# Patient Record
Sex: Male | Born: 1995 | Race: White | Hispanic: No
Health system: Western US, Academic
[De-identification: ages and names within clinical notes are randomized; demographics above are authoritative.]

## PROBLEM LIST (undated history)

## (undated) DIAGNOSIS — Z789 Other specified health status: Secondary | ICD-10-CM

## (undated) HISTORY — PX: HERNIA REPAIR: SHX5222

## (undated) HISTORY — DX: Other specified health status: Z78.9

---
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
## 2016-09-25 DIAGNOSIS — Z789 Other specified health status: Secondary | ICD-10-CM

## 2016-09-25 HISTORY — DX: Other specified health status: Z78.9

## 2016-12-19 ENCOUNTER — Other Ambulatory Visit: Payer: Self-pay | Admitting: Family Medicine

## 2016-12-19 DIAGNOSIS — K4090 Unilateral inguinal hernia, without obstruction or gangrene, not specified as recurrent: Secondary | ICD-10-CM

## 2016-12-21 ENCOUNTER — Ambulatory Visit
Admission: RE | Admit: 2016-12-21 | Discharge: 2016-12-21 | Disposition: A | Discharge status: Home / Self Care | Payer: BLUE CROSS/BLUE SHIELD | Source: Ambulatory Visit | Attending: Family Medicine | Admitting: Family Medicine

## 2016-12-21 DIAGNOSIS — K4090 Unilateral inguinal hernia, without obstruction or gangrene, not specified as recurrent: Secondary | ICD-10-CM

## 2016-12-21 DIAGNOSIS — N5089 Other specified disorders of the male genital organs: Secondary | ICD-10-CM | POA: Diagnosis present

## 2016-12-21 DIAGNOSIS — N432 Other hydrocele: Secondary | ICD-10-CM | POA: Insufficient documentation

## 2016-12-27 ENCOUNTER — Encounter: Payer: Self-pay | Admitting: *Deleted

## 2017-01-02 ENCOUNTER — Ambulatory Visit (INDEPENDENT_AMBULATORY_CARE_PROVIDER_SITE_OTHER): Payer: BLUE CROSS/BLUE SHIELD | Admitting: General Surgery

## 2017-01-02 ENCOUNTER — Encounter: Payer: Self-pay | Admitting: General Surgery

## 2017-01-02 VITALS — BP 122/74 | HR 56 | Resp 12 | Ht 70.0 in | Wt 182.0 lb

## 2017-01-02 DIAGNOSIS — K4090 Unilateral inguinal hernia, without obstruction or gangrene, not specified as recurrent: Secondary | ICD-10-CM

## 2017-01-02 NOTE — Progress Notes (Signed)
Patient ID: Tom Wallace, male   DOB: 1995/10/17, 21 y.o.   MRN: [id number]  Chief Complaint  Patient presents with  . Hernia    HPI Tom Wallace is a 21 y.o. male.  Patient here today for an evaluation of a hernia.  He states he has been lifting weights recently and he noticed a knot in the right groin about 2-3 weeks ago.  It does not seem to be causing any abdominal pain.  No nausea, vomiting, constipation or diarrhea noted. Ultrasound was done 12-21-16. Patient is concerned about leaving for summer camp at the end of May without this being resolved.   HPI  Past Medical History:  Diagnosis Date  . Patient denies medical problems 2018    Past Surgical History:  Procedure Laterality Date  . TONSILLECTOMY AND ADENOIDECTOMY  2010    History reviewed. No pertinent family history.  Social History Social History  Substance Use Topics  . Smoking status: Never Smoker  . Smokeless tobacco: Never Used  . Alcohol use Yes     Comment: occasionally    No Known Allergies  No current outpatient prescriptions on file.   No current facility-administered medications for this visit.     Review of Systems Review of Systems  Constitutional: Negative.   Respiratory: Negative.   Cardiovascular: Negative.     Blood pressure 122/74, pulse (!) 56, resp. rate 12, height  (1.778 m), weight 182 lb (82.6 kg).  Physical Exam Physical Exam  Constitutional: He is oriented to person, place, and time. He appears well-developed and well-nourished.  HENT:  Mouth/Throat: Oropharynx is clear and moist.  Eyes: Conjunctivae are normal. No scleral icterus.  Neck: Neck supple.  Cardiovascular: Normal rate, regular rhythm and normal heart sounds.   Pulmonary/Chest: Effort normal and breath sounds normal.  Abdominal: Soft. Normal appearance and bowel sounds are normal. There is no tenderness. No hernia.  Lymphadenopathy:    He has no cervical adenopathy.  Neurological: He is alert and  oriented to person, place, and time.  Skin: Skin is warm and dry.  Psychiatric: His behavior is normal.   No hernia palpated on right or left inguinal region.   Data Reviewed  [place] - Rt lower extrem from 12/21/16 showed possible hernia containing fat but no bowel. Hernia was noted as palpable at that time.    Assessment    No apparent hernia noted. It's possible that there is a small right inguinal hernia as per [place], but it was reduced at the time of exam and therefore was not palpable. It may become more pronounced over time, but intervention is not currently indicated. Ultrasound is not ideal imaging for hernias, CT scan may be more helpful.     Plan    Recommend CT scan for further evaluation, he will discuss with his parents and let us know what he decides.  The patient is aware to call back for any pain, questions or new concerns.    HPI, Physical Exam, Assessment and Plan have been scribed under the direction and in the presence of Kathreen Cosier, MD  Dorathy Daft, RN   I have completed the exam and reviewed the above documentation for accuracy and completeness.  I agree with the above.  Museum/gallery conservator has been used and any errors in dictation or transcription are unintentional.  Gizel Riedlinger G. Evette Cristal, M.D., F.A.C.S.  Gerlene Burdock G 01/08/2017, 10:17 AM

## 2017-01-02 NOTE — Patient Instructions (Addendum)
The patient is aware to call back for any questions or concerns.  Inguinal Hernia, Adult An inguinal hernia is when fat or the intestines push through the area where the leg meets the lower belly (groin) and make a rounded lump (bulge). This condition happens over time. There are three types of inguinal hernias. These types include:  Hernias that can be pushed back into the belly (are reducible).  Hernias that cannot be pushed back into the belly (are incarcerated).  Hernias that cannot be pushed back into the belly and lose their blood supply (get strangulated). This type needs emergency surgery. Follow these instructions at home: Lifestyle   Drink enough fluid to keep your urine (pee) clear or pale yellow.  Eat plenty of fruits, vegetables, and whole grains. These have a lot of fiber. Talk with your doctor if you have questions.  Avoid lifting heavy objects.  Avoid standing for long periods of time.  Do not use tobacco products. These include cigarettes, chewing tobacco, or e-cigarettes. If you need help quitting, ask your doctor.  Try to stay at a healthy weight. General instructions   Do not try to force the hernia back in.  Watch your hernia for any changes in color or size. Let your doctor know if there are any changes.  Take over-the-counter and prescription medicines only as told by your doctor.  Keep all follow-up visits as told by your doctor. This is important. Contact a doctor if:  You have a fever.  You have new symptoms.  Your symptoms get worse. Get help right away if:  The area where the legs meets the lower belly has:  Pain that gets worse suddenly.  A bulge that gets bigger suddenly and does not go down.  A bulge that turns red or purple.  A bulge that is painful to the touch.  You are a man and your scrotum:  Suddenly feels painful.  Suddenly changes in size.  You feel sick to your stomach (nauseous) and this feeling does not go  away.  You throw up (vomit) and this keeps happening.  You feel your heart beating a lot more quickly than normal.  You cannot poop (have a bowel movement) or pass gas. This information is not intended to replace advice given to you by your health care provider. Make sure you discuss any questions you have with your health care provider. Document Released: 10/12/2006 Document Revised: 02/17/2016 Document Reviewed: 07/22/2014 Elsevier Interactive Patient Education  2017 Elsevier Inc.  

## 2017-01-30 ENCOUNTER — Encounter: Payer: Self-pay | Admitting: General Surgery

## 2017-01-30 ENCOUNTER — Ambulatory Visit (INDEPENDENT_AMBULATORY_CARE_PROVIDER_SITE_OTHER): Payer: BLUE CROSS/BLUE SHIELD | Admitting: General Surgery

## 2017-01-30 VITALS — BP 130/80 | HR 84 | Resp 12 | Ht 70.0 in | Wt 182.0 lb

## 2017-01-30 DIAGNOSIS — R1031 Right lower quadrant pain: Secondary | ICD-10-CM | POA: Diagnosis not present

## 2017-01-30 NOTE — Progress Notes (Signed)
Patient ID: Tom Wallace, male   DOB: 28-Jan-1996, 21 y.o.   MRN: [id number]  Chief Complaint  Patient presents with  . Other    HPI Tom Wallace is a 21 y.o. male here today for his re check right inguinal hernia  . Patient states when he runs the area is painful and swollen. He did not get a CT yet. Marland KitchenHPI  Past Medical History:  Diagnosis Date  . Patient denies medical problems 2018    Past Surgical History:  Procedure Laterality Date  . TONSILLECTOMY AND ADENOIDECTOMY  2010    No family history on file.  Social History Social History  Substance Use Topics  . Smoking status: Never Smoker  . Smokeless tobacco: Never Used  . Alcohol use Yes     Comment: occasionally    No Known Allergies  No current outpatient prescriptions on file.   No current facility-administered medications for this visit.     Review of Systems Review of Systems  Constitutional: Negative.   Respiratory: Negative.   Cardiovascular: Negative.     Blood pressure 130/80, pulse 84, resp. rate 12, height _0  (1.778 m), weight 182 lb (82.6 kg).  Physical Exam Physical Exam  Constitutional: He is oriented to person, place, and time. He appears well-developed and well-nourished.  Abdominal: Soft. Normal appearance. There is no tenderness.  Neurological: He is alert and oriented to person, place, and time.  Skin: Skin is warm and dry.    Data Reviewed Prior notes reviewed   Assessment    Right inguinal pain. No apparent hernia noted on exam. It's possible that there is a small right inguinal hernia, but would need a CT scan to confirm before considering surgical repair. Informed patient of this, and he was agreeable.     Plan    Patient to have a ct scan done . Follow up TBD.     HPI, Physical Exam, Assessment and Plan have been scribed under the direction and in the presence of Mckinley Jewel, MD  Gaspar Cola, CMA  The patient is scheduled for a CT abdomen pelvis with oral  contrast only at [facility] on 01/31/17 at 3:00 pm. He will arrive by 2:45 pm and have nothing to eat or drink for 4 hours prior. He will pick up a prep kit today. The patient is aware of date, time, and instructions.  Documented by Lesly Rubenstein LPN  I have completed the exam and reviewed the above documentation for accuracy and completeness.  I agree with the above.  Haematologist has been used and any errors in dictation or transcription are unintentional.  Sharnika Binney G. Jamal Collin, M.D., F.A.C.S.  Junie Panning G 01/30/2017, 12:59 PM

## 2017-01-30 NOTE — Patient Instructions (Addendum)
Patient to have a ct scan done .   The patient is scheduled for a CT abdomen pelvis with oral contrast only at [facility] on 01/31/17 at 3:00 pm. He will arrive by 2:45 pm and have nothing to eat or drink for 4 hours prior. He will pick up a prep kit today. The patient is aware of date, time, and instructions.

## 2017-01-31 ENCOUNTER — Ambulatory Visit
Admission: RE | Admit: 2017-01-31 | Discharge: 2017-01-31 | Disposition: A | Discharge status: Home / Self Care | Payer: BLUE CROSS/BLUE SHIELD | Source: Ambulatory Visit | Attending: General Surgery | Admitting: General Surgery

## 2017-01-31 DIAGNOSIS — R935 Abnormal findings on diagnostic imaging of other abdominal regions, including retroperitoneum: Secondary | ICD-10-CM | POA: Diagnosis not present

## 2017-01-31 DIAGNOSIS — R1031 Right lower quadrant pain: Secondary | ICD-10-CM | POA: Diagnosis present

## 2017-01-31 NOTE — Progress Notes (Signed)
Appointment scheduled for 02-01-17 at 10:30 am. Patient aware.

## 2017-02-01 ENCOUNTER — Ambulatory Visit (INDEPENDENT_AMBULATORY_CARE_PROVIDER_SITE_OTHER): Payer: BLUE CROSS/BLUE SHIELD | Admitting: General Surgery

## 2017-02-01 ENCOUNTER — Encounter: Payer: Self-pay | Admitting: General Surgery

## 2017-02-01 VITALS — BP 124/68 | HR 54 | Resp 12 | Ht 70.0 in | Wt 182.0 lb

## 2017-02-01 DIAGNOSIS — R1031 Right lower quadrant pain: Secondary | ICD-10-CM

## 2017-02-01 NOTE — Progress Notes (Signed)
Patient ID: Tom Wallace, male   DOB: 09/27/1995, 20 y.o.   MRN: [id number]  Chief Complaint  Patient presents with  . Other    HPI Tom Wallace is a 20 y.o. male here today to discuss ct scan results .  HPI  Past Medical History:  Diagnosis Date  . Patient denies medical problems 2018    Past Surgical History:  Procedure Laterality Date  . TONSILLECTOMY AND ADENOIDECTOMY  2010    No family history on file.  Social History Social History  Substance Use Topics  . Smoking status: Never Smoker  . Smokeless tobacco: Never Used  . Alcohol use Yes     Comment: occasionally    No Known Allergies  No current outpatient prescriptions on file.   No current facility-administered medications for this visit.     Review of Systems Review of Systems  Constitutional: Negative.   Respiratory: Negative.   Cardiovascular: Negative.     Blood pressure 124/68, pulse (!) 54, resp. rate 12, height 5' 10" (1.778 m), weight 182 lb (82.6 kg).  Physical Exam Physical Exam  Constitutional: He is oriented to person, place, and time. He appears well-developed and well-nourished.  HENT:  Mouth/Throat: No oropharyngeal exudate.  Eyes: Conjunctivae are normal. No scleral icterus.  Neck: Neck supple.  Cardiovascular: Normal rate, regular rhythm and normal heart sounds.   Pulmonary/Chest: Effort normal and breath sounds normal.  Abdominal: Soft. Bowel sounds are normal.  Lymphadenopathy:    He has no cervical adenopathy.  Neurological: He is alert and oriented to person, place, and time.  Skin: Skin is warm and dry.  Psychiatric: His behavior is normal.    Data Reviewed CT reviewed  IMPRESSION: Normal-appearing appendix.  Minimal fat within the inguinal canals bilaterally right slightly greater than left.  In the right hemipelvis anteriorly near the origin of the inguinal canal a small amount of induration in the mesenteric fat is noted. A few small lymph nodes are  noted in this may represent some very early mesenteric adenitis. This does not appear to be related to the adjacent bowel or any of the other adjacent structures.  Assessment    Ongoing right groin pain. Ct findings of inflamed mesentery/omentum near right groin- uncertain nature.  Pt advised fully on these findings. Diagnostic laparoscopy is reasonable given his ongoing pain in this region.  He is agreeable to this plan. Plan    Patient to be schedule for diagnostic laparoscopy.       Felita Bump G 02/01/2017, 10:47 AM   

## 2017-02-01 NOTE — Addendum Note (Signed)
Addended by: Kieth BrightlySANKAR, SEEPLAPUTHUR G on: 02/01/2017 02:45 PM   Modules accepted: Orders, SmartSet

## 2017-02-01 NOTE — Patient Instructions (Signed)
Patient to be scheduled for diagnostic laparoscopy.

## 2017-02-06 ENCOUNTER — Encounter
Admission: RE | Admit: 2017-02-06 | Discharge: 2017-02-06 | Disposition: A | Discharge status: Home / Self Care | Payer: BLUE CROSS/BLUE SHIELD | Source: Ambulatory Visit | Attending: General Surgery | Admitting: General Surgery

## 2017-02-06 NOTE — Patient Instructions (Signed)
  Your procedure is scheduled on: 02-12-17 Report to Same Day Surgery 2nd floor medical mall [facility](Medical Mall Entrance-take elevator on left to 2nd floor.  Check in with surgery information desk.) To find out your arrival time please call [phone] between 1PM - 3PM on 02-11-17  Remember: Instructions that are not followed completely may result in serious medical risk, up to and including death, or upon the discretion of your surgeon and anesthesiologist your surgery may need to be rescheduled.    _x___ 1. Do not eat food or drink liquids after midnight. No gum chewing or hard candies.     __x__ 2. No Alcohol for 24 hours before or after surgery.   __x__3. No Smoking for 24 prior to surgery.   ____  4. Bring all medications with you on the day of surgery if instructed.    __x__ 5. Notify your doctor if there is any change in your medical condition     (cold, fever, infections).     Do not wear jewelry, make-up, hairpins, clips or nail polish.  Do not wear lotions, powders, or perfumes. You may wear deodorant.  Do not shave 48 hours prior to surgery. Men may shave face and neck.  Do not bring valuables to the hospital.    [facility] any belongings or valuables.               Contacts, dentures or bridgework may not be worn into surgery.  Leave your suitcase in the car. After surgery it may be brought to your room.  For patients admitted to the hospital, discharge time is determined by your treatment team.   Patients discharged the day of surgery will not be allowed to drive home.  You will need someone to drive you home and stay with you the night of your procedure.    Please read over the following fact sheets that you were given:     ____ Take anti-hypertensive (unless it includes a diuretic), cardiac, seizure, asthma,     anti-reflux and psychiatric medicines. These include:  1. NONE  2.  3.  4.  5.  6.  ____Fleets enema or Magnesium Citrate as  directed.   ____ Use CHG Soap or sage wipes as directed on instruction sheet   ____ Use inhalers on the day of surgery and bring to hospital day of surgery  ____ Stop Metformin and Janumet 2 days prior to surgery.    ____ Take 1/2 of usual insulin dose the night before surgery and none on the morning surgery.   ____ Follow recommendations from Cardiologist, Pulmonologist or PCP regarding stopping Aspirin, Coumadin, Pllavix ,Eliquis, Effient, or Pradaxa, and Pletal.  X____Stop Anti-inflammatories such as ADVIL, Aleve, Ibuprofen, Motrin, Naproxen, Naprosyn, Goodies powders or aspirin products NOW-OK to take Tylenol    ____ Stop supplements until after surgery.     ____ Bring C-Pap to the hospital.

## 2017-02-11 MED ORDER — CEFAZOLIN SODIUM-DEXTROSE 2-4 GM/100ML-% IV SOLN
2.0000 g | INTRAVENOUS | Status: AC
  Administered 2017-02-12: 2 g via INTRAVENOUS

## 2017-02-12 ENCOUNTER — Ambulatory Visit: Payer: BLUE CROSS/BLUE SHIELD | Admitting: Anesthesiology

## 2017-02-12 ENCOUNTER — Encounter
Admission: RE | Disposition: A | Discharge status: Home / Self Care | Payer: Self-pay | Source: Ambulatory Visit | Attending: General Surgery

## 2017-02-12 ENCOUNTER — Encounter: Payer: Self-pay | Admitting: *Deleted

## 2017-02-12 ENCOUNTER — Ambulatory Visit
Admission: RE | Admit: 2017-02-12 | Discharge: 2017-02-12 | Disposition: A | Discharge status: Home / Self Care | Payer: BLUE CROSS/BLUE SHIELD | Source: Ambulatory Visit | Attending: General Surgery | Admitting: General Surgery

## 2017-02-12 DIAGNOSIS — R1031 Right lower quadrant pain: Secondary | ICD-10-CM

## 2017-02-12 DIAGNOSIS — K4090 Unilateral inguinal hernia, without obstruction or gangrene, not specified as recurrent: Secondary | ICD-10-CM | POA: Insufficient documentation

## 2017-02-12 HISTORY — PX: LAPAROSCOPY: SHX197

## 2017-02-12 HISTORY — PX: INGUINAL HERNIA REPAIR: SHX194

## 2017-02-12 SURGERY — LAPAROSCOPY, DIAGNOSTIC
Anesthesia: General | Laterality: Right | Wound class: Clean

## 2017-02-12 MED ORDER — SUGAMMADEX SODIUM 200 MG/2ML IV SOLN
INTRAVENOUS | Status: AC
  Filled 2017-02-12: qty 2

## 2017-02-12 MED ORDER — PROPOFOL 10 MG/ML IV BOLUS
INTRAVENOUS | Status: DC | PRN
  Administered 2017-02-12: 150 mg via INTRAVENOUS
  Administered 2017-02-12: 50 mg via INTRAVENOUS

## 2017-02-12 MED ORDER — ACETAMINOPHEN 10 MG/ML IV SOLN
INTRAVENOUS | Status: DC | PRN
  Administered 2017-02-12: 1000 mg via INTRAVENOUS

## 2017-02-12 MED ORDER — MIDAZOLAM HCL 2 MG/2ML IJ SOLN
INTRAMUSCULAR | Status: DC | PRN
  Administered 2017-02-12 (×2): 1 mg via INTRAVENOUS

## 2017-02-12 MED ORDER — OXYCODONE-ACETAMINOPHEN 5-325 MG PO TABS
1.0000 | ORAL_TABLET | ORAL | 0 refills | Status: DC | PRN
Start: 2017-02-12 — End: 2017-02-20

## 2017-02-12 MED ORDER — MIDAZOLAM HCL 2 MG/2ML IJ SOLN
INTRAMUSCULAR | Status: AC
  Filled 2017-02-12: qty 2

## 2017-02-12 MED ORDER — CEFAZOLIN SODIUM-DEXTROSE 2-4 GM/100ML-% IV SOLN
INTRAVENOUS | Status: AC
  Filled 2017-02-12: qty 100

## 2017-02-12 MED ORDER — FENTANYL CITRATE (PF) 100 MCG/2ML IJ SOLN
INTRAMUSCULAR | Status: AC
  Administered 2017-02-12: 25 ug via INTRAVENOUS
  Filled 2017-02-12: qty 2

## 2017-02-12 MED ORDER — ROCURONIUM BROMIDE 100 MG/10ML IV SOLN
INTRAVENOUS | Status: DC | PRN
  Administered 2017-02-12 (×2): 20 mg via INTRAVENOUS
  Administered 2017-02-12: 30 mg via INTRAVENOUS

## 2017-02-12 MED ORDER — OXYCODONE-ACETAMINOPHEN 5-325 MG PO TABS
1.0000 | ORAL_TABLET | Freq: Once | ORAL | Status: AC
  Administered 2017-02-12: 1 via ORAL

## 2017-02-12 MED ORDER — FENTANYL CITRATE (PF) 100 MCG/2ML IJ SOLN
INTRAMUSCULAR | Status: AC
  Filled 2017-02-12: qty 2

## 2017-02-12 MED ORDER — FAMOTIDINE 20 MG PO TABS
ORAL_TABLET | ORAL | Status: AC
  Filled 2017-02-12: qty 1

## 2017-02-12 MED ORDER — ACETAMINOPHEN 10 MG/ML IV SOLN
INTRAVENOUS | Status: AC
  Filled 2017-02-12: qty 100

## 2017-02-12 MED ORDER — LACTATED RINGERS IV SOLN
INTRAVENOUS | Status: DC
  Administered 2017-02-12 (×2): via INTRAVENOUS

## 2017-02-12 MED ORDER — PROPOFOL 10 MG/ML IV BOLUS
INTRAVENOUS | Status: AC
  Filled 2017-02-12: qty 20

## 2017-02-12 MED ORDER — FENTANYL CITRATE (PF) 100 MCG/2ML IJ SOLN
INTRAMUSCULAR | Status: DC | PRN
  Administered 2017-02-12 (×3): 50 ug via INTRAVENOUS

## 2017-02-12 MED ORDER — EPHEDRINE SULFATE 50 MG/ML IJ SOLN
INTRAMUSCULAR | Status: DC | PRN
  Administered 2017-02-12: 10 mg via INTRAVENOUS

## 2017-02-12 MED ORDER — CHLORHEXIDINE GLUCONATE CLOTH 2 % EX PADS: 6.0000 | MEDICATED_PAD | Freq: Once | CUTANEOUS | Status: DC

## 2017-02-12 MED ORDER — ROCURONIUM BROMIDE 50 MG/5ML IV SOLN
INTRAVENOUS | Status: AC
  Filled 2017-02-12: qty 1

## 2017-02-12 MED ORDER — LIDOCAINE HCL (CARDIAC) 20 MG/ML IV SOLN
INTRAVENOUS | Status: DC | PRN
  Administered 2017-02-12: 60 mg via INTRAVENOUS

## 2017-02-12 MED ORDER — OXYCODONE-ACETAMINOPHEN 5-325 MG PO TABS
ORAL_TABLET | ORAL | Status: AC
  Filled 2017-02-12: qty 1

## 2017-02-12 MED ORDER — FENTANYL CITRATE (PF) 100 MCG/2ML IJ SOLN
25.0000 ug | INTRAMUSCULAR | Status: DC | PRN
  Administered 2017-02-12 (×3): 25 ug via INTRAVENOUS

## 2017-02-12 MED ORDER — ROCURONIUM BROMIDE 50 MG/5ML IV SOLN
INTRAVENOUS | Status: AC
Start: 2017-02-12 — End: 2017-02-12
  Filled 2017-02-12: qty 1

## 2017-02-12 MED ORDER — EPHEDRINE SULFATE 50 MG/ML IJ SOLN
INTRAMUSCULAR | Status: AC
  Filled 2017-02-12: qty 1

## 2017-02-12 MED ORDER — LIDOCAINE HCL (PF) 2 % IJ SOLN
INTRAMUSCULAR | Status: AC
  Filled 2017-02-12: qty 2

## 2017-02-12 MED ORDER — ONDANSETRON HCL 4 MG/2ML IJ SOLN
INTRAMUSCULAR | Status: AC
  Filled 2017-02-12: qty 2

## 2017-02-12 MED ORDER — SUGAMMADEX SODIUM 500 MG/5ML IV SOLN
INTRAVENOUS | Status: DC | PRN
  Administered 2017-02-12: 164.2 mg via INTRAVENOUS

## 2017-02-12 MED ORDER — FAMOTIDINE 20 MG PO TABS
20.0000 mg | ORAL_TABLET | Freq: Once | ORAL | Status: AC
  Administered 2017-02-12: 20 mg via ORAL

## 2017-02-12 MED ORDER — ONDANSETRON HCL 4 MG/2ML IJ SOLN: 4.0000 mg | Freq: Once | INTRAMUSCULAR | Status: DC | PRN

## 2017-02-12 MED ORDER — GLYCOPYRROLATE 0.2 MG/ML IJ SOLN: INTRAMUSCULAR | Status: DC | PRN

## 2017-02-12 SURGICAL SUPPLY — 49 items
BLADE CLIPPER SURG (BLADE) ×4 IMPLANT
BLADE SURG 10 STRL SS SAFETY (BLADE) IMPLANT
BLADE SURG 11 STRL SS SAFETY (MISCELLANEOUS) ×4 IMPLANT
BLADE SURG 15 STRL SS SAFETY (BLADE) IMPLANT
CANISTER SUCT 1200ML W/VALVE (MISCELLANEOUS) ×4 IMPLANT
CANNULA DILATOR 10 W/SLV (CANNULA) ×3 IMPLANT
CANNULA DILATOR 10MM W/SLV (CANNULA) ×1
CATH TRAY 16F METER LATEX (MISCELLANEOUS) ×4 IMPLANT
CHLORAPREP W/TINT 26ML (MISCELLANEOUS) ×4 IMPLANT
CLEANER CAUTERY TIP 5X5 PAD (MISCELLANEOUS) IMPLANT
DEFOGGER SCOPE WARMER CLEARIFY (MISCELLANEOUS) ×4 IMPLANT
DERMABOND ADVANCED (GAUZE/BANDAGES/DRESSINGS) ×2
DERMABOND ADVANCED .7 DNX12 (GAUZE/BANDAGES/DRESSINGS) ×2 IMPLANT
DEVICE SECURE STRAP 25 ABSORB (INSTRUMENTS) ×4 IMPLANT
DRAPE INCISE IOBAN 66X45 STRL (DRAPES) ×4 IMPLANT
ELECT REM PT RETURN 9FT ADLT (ELECTROSURGICAL) ×4
ELECTRODE REM PT RTRN 9FT ADLT (ELECTROSURGICAL) ×2 IMPLANT
GLOVE BIO SURGEON STRL SZ7 (GLOVE) ×16 IMPLANT
GOWN STRL REUS W/ TWL LRG LVL3 (GOWN DISPOSABLE) ×4 IMPLANT
GOWN STRL REUS W/TWL LRG LVL3 (GOWN DISPOSABLE) ×4
GRASPER SUT TROCAR 14GX15 (MISCELLANEOUS) ×4 IMPLANT
HANDLE YANKAUER SUCT BULB TIP (MISCELLANEOUS) IMPLANT
HOLDER FOLEY CATH W/STRAP (MISCELLANEOUS) IMPLANT
IRRIGATION STRYKERFLOW (MISCELLANEOUS) ×2 IMPLANT
IRRIGATOR STRYKERFLOW (MISCELLANEOUS) ×4
IV LACTATED RINGERS 1000ML (IV SOLUTION) ×4 IMPLANT
KIT RM TURNOVER STRD PROC AR (KITS) ×4 IMPLANT
LABEL OR SOLS (LABEL) ×4 IMPLANT
MESH 3DMAX 3X5 RT MED (Mesh General) ×4 IMPLANT
NDL INSUFF ACCESS 14 VERSASTEP (NEEDLE) ×4 IMPLANT
NS IRRIG 500ML POUR BTL (IV SOLUTION) ×4 IMPLANT
PACK LAP CHOLECYSTECTOMY (MISCELLANEOUS) ×4 IMPLANT
PAD CLEANER CAUTERY TIP 5X5 (MISCELLANEOUS)
PENCIL ELECTRO HAND CTR (MISCELLANEOUS) IMPLANT
POUCH ENDO CATCH 10MM SPEC (MISCELLANEOUS) IMPLANT
SCISSORS METZENBAUM CVD 33 (INSTRUMENTS) ×4 IMPLANT
SHEARS HARMONIC ACE PLUS 36CM (ENDOMECHANICALS) IMPLANT
SLEEVE ENDOPATH XCEL 5M (ENDOMECHANICALS) IMPLANT
SPONGE LAP 18X18 5 PK (GAUZE/BANDAGES/DRESSINGS) IMPLANT
STAPLER SKIN PROX 35W (STAPLE) IMPLANT
SUT SILK 3-0 (SUTURE)
SUT SILK 3-0 SH-1 18XCR BRD (SUTURE)
SUT VIC AB 0 SH 27 (SUTURE) ×4 IMPLANT
SUT VIC AB 4-0 FS2 27 (SUTURE) ×4 IMPLANT
SUTURE SILK 3-0 SH-1 18XCR BRD (SUTURE) IMPLANT
SYR BULB IRRIG 60ML STRL (SYRINGE) IMPLANT
TROCAR XCEL NON-BLD 11X100MML (ENDOMECHANICALS) ×4 IMPLANT
TROCAR XCEL NON-BLD 5MMX100MML (ENDOMECHANICALS) ×4 IMPLANT
TUBING INSUFFLATOR HI FLOW (MISCELLANEOUS) ×4 IMPLANT

## 2017-02-12 NOTE — Anesthesia Procedure Notes (Signed)
Procedure Name: Intubation Date/Time: 02/12/2017 7:25 AM Performed by: Allean Found Pre-anesthesia Checklist: Emergency Drugs available, Suction available, Patient identified, Patient being monitored and Timeout performed Patient Re-evaluated:Patient Re-evaluated prior to inductionOxygen Delivery Method: Circle system utilized Preoxygenation: Pre-oxygenation with 100% oxygen Intubation Type: IV induction Ventilation: Mask ventilation without difficulty Laryngoscope Size: Mac and 3 Grade View: Grade I Tube type: Oral Tube size: 7.5 mm Number of attempts: 1 Airway Equipment and Method: Stylet Placement Confirmation: ETT inserted through vocal cords under direct vision,  positive ETCO2 and breath sounds checked- equal and bilateral Secured at: 23 cm Tube secured with: Tape Dental Injury: Teeth and Oropharynx as per pre-operative assessment

## 2017-02-12 NOTE — Anesthesia Preprocedure Evaluation (Signed)
Anesthesia Evaluation  Patient identified by MRN, date of birth, ID band Patient awake    Reviewed: Allergy & Precautions, NPO status , Patient's Chart, lab work & pertinent test results, reviewed documented beta blocker date and time   Airway Mallampati: II  TM Distance: >3 FB     Dental  (+) Chipped   Pulmonary           Cardiovascular      Neuro/Psych    GI/Hepatic   Endo/Other    Renal/GU      Musculoskeletal   Abdominal   Peds  Hematology   Anesthesia Other Findings   Reproductive/Obstetrics                             Anesthesia Physical Anesthesia Plan  ASA: II  Anesthesia Plan: General   Post-op Pain Management:    Induction: Intravenous  Airway Management Planned: Oral ETT  Additional Equipment:   Intra-op Plan:   Post-operative Plan:   Informed Consent: I have reviewed the patients History and Physical, chart, labs and discussed the procedure including the risks, benefits and alternatives for the proposed anesthesia with the patient or authorized representative who has indicated his/her understanding and acceptance.     Plan Discussed with: CRNA  Anesthesia Plan Comments:         Anesthesia Quick Evaluation  

## 2017-02-12 NOTE — Interval H&P Note (Signed)
History and Physical Interval Note:  02/12/2017 7:08 AM  Harrold DonathNathan Goldring  has presented today for surgery, with the diagnosis of RIGHT GROIN PAIN  The various methods of treatment have been discussed with the patient and family. After consideration of risks, benefits and other options for treatment, the patient has consented to  Procedure(s): LAPAROSCOPY DIAGNOSTIC (N/A) as a surgical intervention .  The patient's history has been reviewed, patient examined, no change in status, stable for surgery.  I have reviewed the patient's chart and labs.  Questions were answered to the patient's satisfaction.     Arasely Akkerman G

## 2017-02-12 NOTE — H&P (View-Only) (Signed)
Patient ID: Tom Wallace, male   DOB: 1996/01/29, 21 y.o.   MRN: [id number]  Chief Complaint  Patient presents with  . Other    HPI Tom Wallace is a 21 y.o. male here today to discuss ct scan results .  HPI  Past Medical History:  Diagnosis Date  . Patient denies medical problems 2018    Past Surgical History:  Procedure Laterality Date  . TONSILLECTOMY AND ADENOIDECTOMY  2010    No family history on file.  Social History Social History  Substance Use Topics  . Smoking status: Never Smoker  . Smokeless tobacco: Never Used  . Alcohol use Yes     Comment: occasionally    No Known Allergies  No current outpatient prescriptions on file.   No current facility-administered medications for this visit.     Review of Systems Review of Systems  Constitutional: Negative.   Respiratory: Negative.   Cardiovascular: Negative.     Blood pressure 124/68, pulse (!) 54, resp. rate 12, height 5\' 10"  (1.778 m), weight 182 lb (82.6 kg).  Physical Exam Physical Exam  Constitutional: He is oriented to person, place, and time. He appears well-developed and well-nourished.  HENT:  Mouth/Throat: No oropharyngeal exudate.  Eyes: Conjunctivae are normal. No scleral icterus.  Neck: Neck supple.  Cardiovascular: Normal rate, regular rhythm and normal heart sounds.   Pulmonary/Chest: Effort normal and breath sounds normal.  Abdominal: Soft. Bowel sounds are normal.  Lymphadenopathy:    He has no cervical adenopathy.  Neurological: He is alert and oriented to person, place, and time.  Skin: Skin is warm and dry.  Psychiatric: His behavior is normal.    Data Reviewed CT reviewed  IMPRESSION: Normal-appearing appendix.  Minimal fat within the inguinal canals bilaterally right slightly greater than left.  In the right hemipelvis anteriorly near the origin of the inguinal canal a small amount of induration in the mesenteric fat is noted. A few small lymph nodes are  noted in this may represent some very early mesenteric adenitis. This does not appear to be related to the adjacent bowel or any of the other adjacent structures.  Assessment    Ongoing right groin pain. Ct findings of inflamed mesentery/omentum near right groin- uncertain nature.  Pt advised fully on these findings. Diagnostic laparoscopy is reasonable given his ongoing pain in this region.  He is agreeable to this plan. Plan    Patient to be schedule for diagnostic laparoscopy.       SANKAR,SEEPLAPUTHUR G 02/01/2017, 10:47 AM

## 2017-02-12 NOTE — Transfer of Care (Signed)
Immediate Anesthesia Transfer of Care Note  Patient: Tom Wallace  Procedure(s) Performed: Procedure(s): LAPAROSCOPY DIAGNOSTIC (N/A) LAPAROSCOPIC INGUINAL HERNIA (Right)  Patient Location: PACU  Anesthesia Type:General  Level of Consciousness: sedated  Airway & Oxygen Therapy: Patient Spontanous Breathing and Patient connected to face mask oxygen  Post-op Assessment: Report given to RN and Post -op Vital signs reviewed and stable  Post vital signs: Reviewed and stable  Last Vitals:  Vitals:   02/12/17 0558 02/12/17 0909  BP: (!) 133/93 130/62  Pulse: 86 (!) 59  Resp: 16 19  Temp: 36.6 C 36.8 C    Last Pain:  Vitals:   02/12/17 0558  TempSrc: Tympanic         Complications: No apparent anesthesia complications

## 2017-02-12 NOTE — Anesthesia Postprocedure Evaluation (Signed)
Anesthesia Post Note  Patient: Tom Wallace  Procedure(Wallace) Performed: Procedure(Wallace) (LRB): LAPAROSCOPY DIAGNOSTIC (N/A) LAPAROSCOPIC INGUINAL HERNIA (Right)  Patient location during evaluation: PACU Anesthesia Type: General Level of consciousness: awake and alert Pain management: pain level controlled Vital Signs Assessment: post-procedure vital signs reviewed and stable Respiratory status: spontaneous breathing, nonlabored ventilation, respiratory function stable and patient connected to nasal cannula oxygen Cardiovascular status: blood pressure returned to baseline and stable Postop Assessment: no signs of nausea or vomiting Anesthetic complications: no     Last Vitals:  Vitals:   02/12/17 1006 02/12/17 1016  BP: (!) 142/65   Pulse: (!) 44   Resp: 16   Temp:  36.6 C    Last Pain:  Vitals:   02/12/17 1016  TempSrc: Temporal  PainSc:                  Tom Wallace

## 2017-02-12 NOTE — Op Note (Signed)
Preop diagnosis: Right inguinal groin pain  Post op diagnosis: Right inguinal hernia indirect  Operation: Laparoscopy repair right inguinal hernia with Bard 3-D mesh  Surgeon: Kathreen CosierS. G. Galan Ghee  Assistant:     Anesthesia: Gen.  Complications: None  EBL: Less than 10 mL  Drains: None  Description: Patient was put to sleep and a Foley catheter was inserted and this was removed at the end of the procedure. The abdomen was prepped and draped as sterile field and timeout performed. Small incision made at the upper lip of the umbilicus and a Veress needle position in the peritoneal cavity verified of the hanging drop method. Pneumoperitoneum was obtained followed by placement of a 10 mm port which was subsequently switched to 11 mm port. With the camera in place there was good visualization of the peritoneal cavity. Evaluation of the inguinal region showed that the patient in fact had an indirect hernia on the right without any contents and it at this time. No hernia was noted in the left inguinal region. The bowel,  appendix and omentum in the vicinity of the right inguinal region appeared normal. The mesentery of the small bowel also appeared normal. Repair of the inguinal hernia was then performed. The peritoneum along the superior margin of the inguinal canal was opened with scissors taking care to avoid any injury to the inferior epigastric artery. The retroperitoneal space was entered into and the inguinal region was then dissected to expose the pubic tubercle and the inguinal ligament. The area of the cord structures was then inspected and the hernial sac was identified which was then freed from the surrounding tissue. It was somewhat firmly adherent to the surrounding tissue and this required slow careful dissection to free up the sac completely. After this was done a Bard 3-D mesh was then placed in the peritoneal cavity and placed across the inguinal region on the right. Secure strap was then used  to tack to the pubic tubercle and the muscular tissue medially and a couple along the inguinal ligament on either side of the internal ring area. 2 tacks were placed in the superior edge. Following this the peritoneum was reapproximated also with secure strap. Small amount of fluid was used to irrigate out the area and suctioned out. The fascial opening the umbilicus was closed with a 0 Vicryl placed with a suture passer. Pneumoperitoneum was released the remaining ports removed. Skin incisions closed with subcuticular 4-0 Vicryl covered with Dermabond. Procedure was well-tolerated he was subsequently returned recovery room stable condition`

## 2017-02-12 NOTE — Anesthesia Post-op Follow-up Note (Cosign Needed)
Anesthesia QCDR form completed.        

## 2017-02-15 ENCOUNTER — Encounter: Payer: Self-pay | Admitting: General Surgery

## 2017-02-20 ENCOUNTER — Ambulatory Visit (INDEPENDENT_AMBULATORY_CARE_PROVIDER_SITE_OTHER): Payer: BLUE CROSS/BLUE SHIELD | Admitting: General Surgery

## 2017-02-20 VITALS — BP 120/74 | HR 82 | Resp 12 | Ht 70.0 in | Wt 182.0 lb

## 2017-02-20 DIAGNOSIS — K4090 Unilateral inguinal hernia, without obstruction or gangrene, not specified as recurrent: Secondary | ICD-10-CM

## 2017-02-20 NOTE — Patient Instructions (Addendum)
Patient to return in one month. Proper lifting techniques reviewed. 

## 2017-02-20 NOTE — Progress Notes (Signed)
Patient ID: Tom Wallace, male   DOB: August 24, 1996, 21 y.o.   MRN: [id number]  Chief Complaint  Patient presents with  . Routine Post Op    HPI Tom Doneathan Furgerson is a 21 y.o. male here today for his post op laparoscopy and right inguinal hernia repair done on 02/12/2017. Patient states he is doing well.  HPI  Past Medical History:  Diagnosis Date  . Patient denies medical problems 2018    Past Surgical History:  Procedure Laterality Date  . INGUINAL HERNIA REPAIR Right 02/12/2017   Procedure: LAPAROSCOPIC INGUINAL HERNIA;  Surgeon: Kieth BrightlySankar, Kenshin Splawn G, MD;  Location: [facility];  Service: General;  Laterality: Right;  . LAPAROSCOPY N/A 02/12/2017   Procedure: LAPAROSCOPY DIAGNOSTIC;  Surgeon: Kieth BrightlySankar, Tineshia Becraft G, MD;  Location: [facility];  Service: General;  Laterality: N/A;  . TONSILLECTOMY AND ADENOIDECTOMY  2010    No family history on file.  Social History Social History  Substance Use Topics  . Smoking status: Never Smoker  . Smokeless tobacco: Never Used  . Alcohol use Yes     Comment: occasionally    No Known Allergies  Current Outpatient Prescriptions  Medication Sig Dispense Refill  . ibuprofen (ADVIL,MOTRIN) 200 MG tablet Take 200 mg by mouth every 6 (six) hours as needed.     No current facility-administered medications for this visit.     Review of Systems Review of Systems  Constitutional: Negative.   Respiratory: Negative.   Cardiovascular: Negative.     Blood pressure 120/74, pulse 82, resp. rate 12, height 5\' 10"  (1.778 m), weight 182 lb (82.6 kg).  Physical Exam Physical Exam  Constitutional: He is oriented to person, place, and time. He appears well-developed and well-nourished.  Cardiovascular: Normal rate, regular rhythm and normal heart sounds.   Pulmonary/Chest: Effort normal and breath sounds normal.  Abdominal: Soft. Bowel sounds are normal.  Right inguinal hernia repair is intact and healing well. Port sites are healing well.    Neurological: He is alert and oriented to person, place, and time.  Skin: Skin is warm and dry.    Data Reviewed Op note reviewed   Assessment    Pt did have a right inguinal hernia on laparoscopy and repair done with Bard 3D mesh  Doing well post op  Plan    Patient to return in one month. Proper lifting techniques reviewed. May increase activity after one more week HPI, Physical Exam, Assessment and Plan have been scribed under the direction and in the presence of Kathreen CosierS. G. Harshaan Whang, MD  Ples SpecterJessica Qualls, CMA     I have completed the exam and reviewed the above documentation for accuracy and completeness.  I agree with the above.  [employer] has been used and any errors in dictation or transcription are unintentional.  Larrissa Stivers G. Evette CristalSankar, M.D., F.A.C.S.    Gerlene BurdockSANKAR,Leshea Jaggers G 02/20/2017, 2:45 PM

## 2017-03-21 ENCOUNTER — Encounter: Payer: Self-pay | Admitting: General Surgery

## 2017-03-21 ENCOUNTER — Ambulatory Visit (INDEPENDENT_AMBULATORY_CARE_PROVIDER_SITE_OTHER): Payer: BLUE CROSS/BLUE SHIELD | Admitting: General Surgery

## 2017-03-21 VITALS — BP 122/70 | HR 62 | Resp 12 | Ht 70.5 in | Wt 181.0 lb

## 2017-03-21 DIAGNOSIS — K4090 Unilateral inguinal hernia, without obstruction or gangrene, not specified as recurrent: Secondary | ICD-10-CM

## 2017-03-21 NOTE — Patient Instructions (Signed)
No restrictions for patient. Return as needed, advised to call office with any questions or concerns.

## 2017-03-21 NOTE — Progress Notes (Signed)
Patient ID: Tom Wallace, male   DOB: 1995/10/11, 21 y.o.   MRN: [id number]  Chief Complaint  Patient presents with  . Routine Post Op    HPI Tom Doneathan Davie is a 21 y.o. male.  Here today for postoperative visit, right inguinal hernia repair on 02-12-17, he states he is doing well. Denies any gastrointestinal issues, bowels are moving regular.  HPI  Past Medical History:  Diagnosis Date  . Patient denies medical problems 2018    Past Surgical History:  Procedure Laterality Date  . INGUINAL HERNIA REPAIR Right 02/12/2017   Procedure: LAPAROSCOPIC INGUINAL HERNIA;  Surgeon: Kieth BrightlySankar, Seeplaputhur G, MD;  Location: [facility];  Service: General;  Laterality: Right;  . LAPAROSCOPY N/A 02/12/2017   Procedure: LAPAROSCOPY DIAGNOSTIC;  Surgeon: Kieth BrightlySankar, Seeplaputhur G, MD;  Location: [facility];  Service: General;  Laterality: N/A;  . TONSILLECTOMY AND ADENOIDECTOMY  2010    No family history on file.  Social History Social History  Substance Use Topics  . Smoking status: Never Smoker  . Smokeless tobacco: Never Used  . Alcohol use Yes     Comment: occasionally    No Known Allergies  Current Outpatient Prescriptions  Medication Sig Dispense Refill  . ibuprofen (ADVIL,MOTRIN) 200 MG tablet Take 200 mg by mouth every 6 (six) hours as needed.     No current facility-administered medications for this visit.     Review of Systems Review of Systems  Constitutional: Negative.   Respiratory: Negative.   Cardiovascular: Negative.     Blood pressure 122/70, pulse 62, resp. rate 12, height 5' 10.5" (1.791 m), weight 181 lb (82.1 kg).  Physical Exam Physical Exam  Constitutional: He is oriented to person, place, and time. He appears well-developed and well-nourished.  Abdominal: Soft. Normal appearance. No hernia.    Neurological: He is alert and oriented to person, place, and time.  Skin: Skin is warm and dry.  Psychiatric: He has a normal mood and affect. His behavior is  normal.    Data Reviewed Prior notes reviewed.  Assessment    History of right inguinal hernia- s/p laproscopy repair with Bard 3D mesh,doing well, no complaints.    Plan    No restrictions for patient. Return as needed, advised to call office with any questions or concerns.     HPI, Physical Exam, Assessment and Plan have been scribed under the direction and in the presence of Kathreen CosierS. G. Sankar, MD  Dorathy DaftMarsha Hatch, RN  I have completed the exam and reviewed the above documentation for accuracy and completeness.  I agree with the above.  M[employer]gallery conserv[employer] has been used and any errors in dictation or transcription are unintentional.  Seeplaputhur G. Evette CristalSankar, M.D., F.A.C.S.   Gerlene BurdockSANKAR,SEEPLAPUTHUR G 03/21/2017, 9:28 AM

## 2017-03-23 MED ORDER — KETOROLAC TROMETHAMINE 30 MG/ML IJ SOLN
INTRAMUSCULAR | Status: DC | PRN
  Administered 2017-02-12: 30 mg via INTRAVENOUS

## 2017-03-23 NOTE — Addendum Note (Signed)
Addendum  created 03/23/17 1208 by Stormy Fabianurtis, Emoni Yang, CRNA   Anesthesia Intra Meds edited

## 2018-03-18 IMAGING — CT CT ABD-PELV W/O CM
1 of 2 series · 15 of 32 positions shown, 19 images · non-contrast
Comparison: 12/21/2016

CLINICAL DATA: Follow-up ultrasound

EXAM:
CT ABDOMEN AND PELVIS WITHOUT CONTRAST
TECHNIQUE: Multidetector CT imaging of the abdomen and pelvis was performed
following the standard protocol without IV contrast.

[Series 2: axial st · axial · 0.71mm/px · z∈[-1121,-671]mm · 15 of 98 slices shown, 19 images]
[im 4/98  soft-tissue]
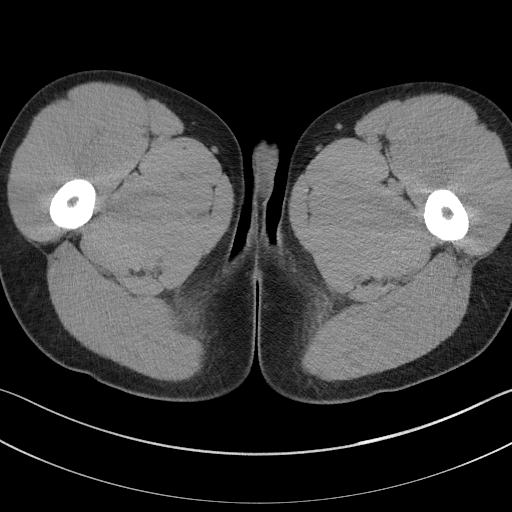
[im 4/98  bone]
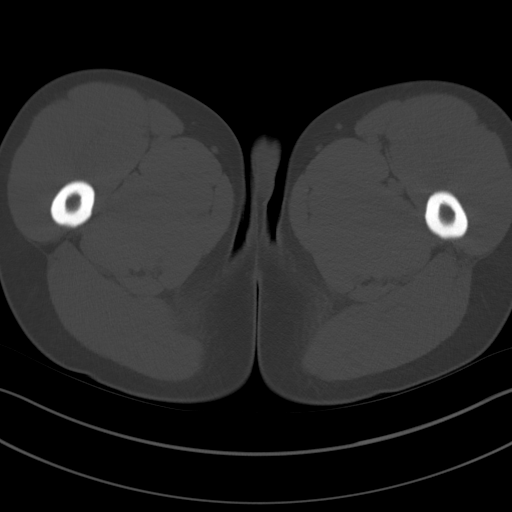
[im 12/98  soft-tissue]
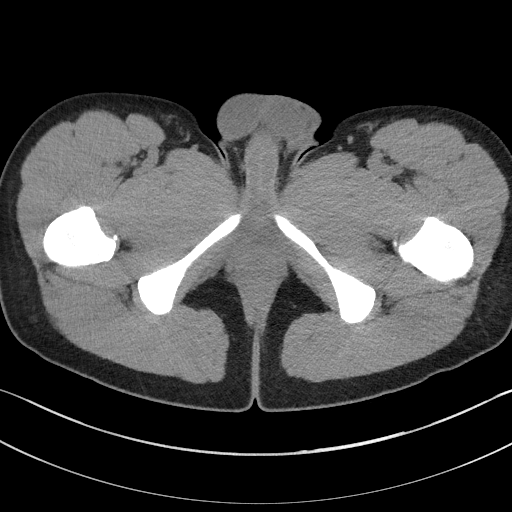
[im 20/98  soft-tissue]
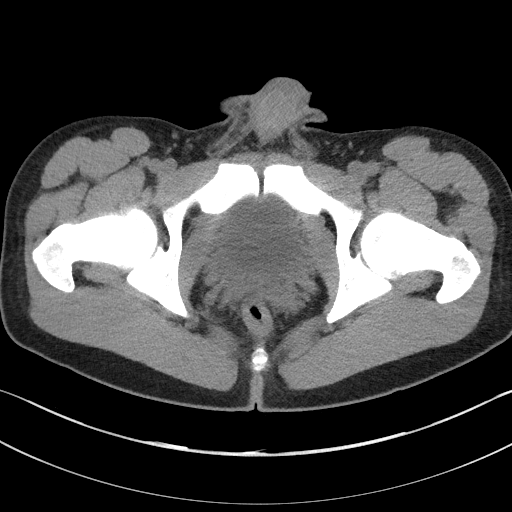
[im 28/98  soft-tissue]
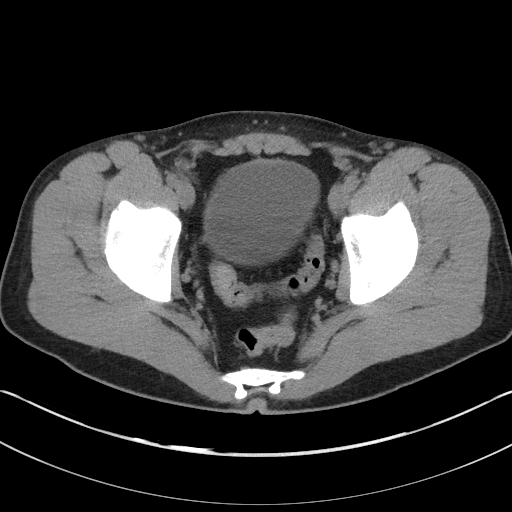
[im 35/98  soft-tissue]
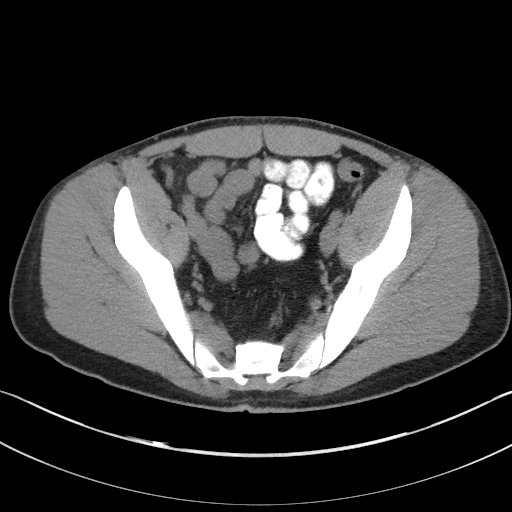
[im 43/98  soft-tissue]
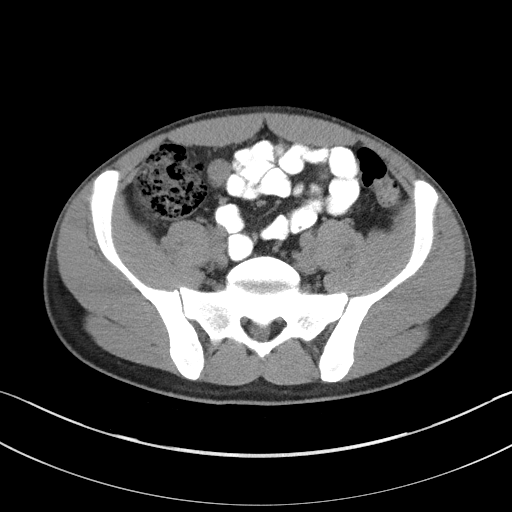
[im 51/98  soft-tissue]
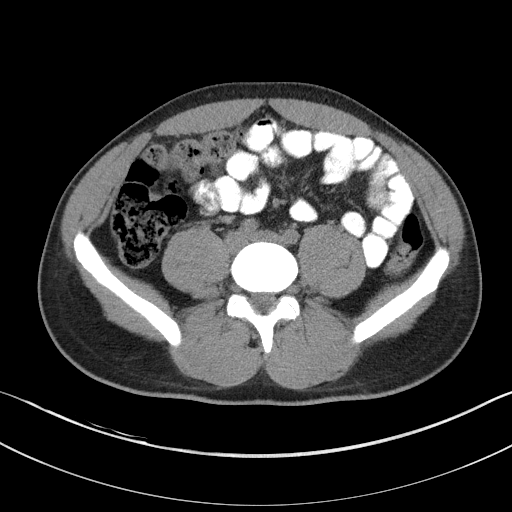
[im 55/98  soft-tissue]
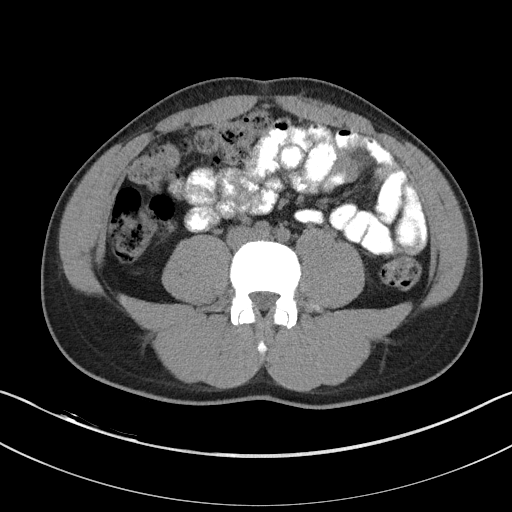
[im 63/98  soft-tissue]
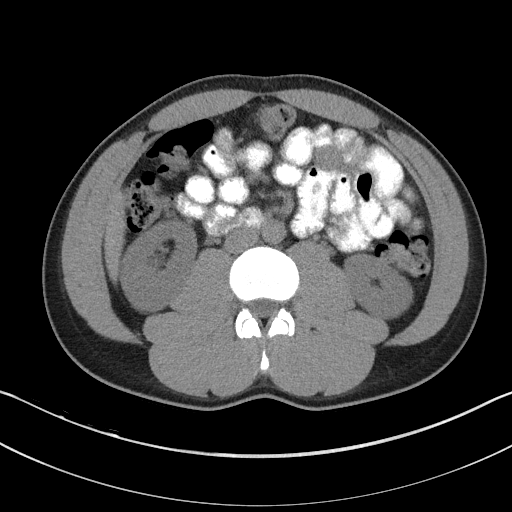
[im 63/98  bone]
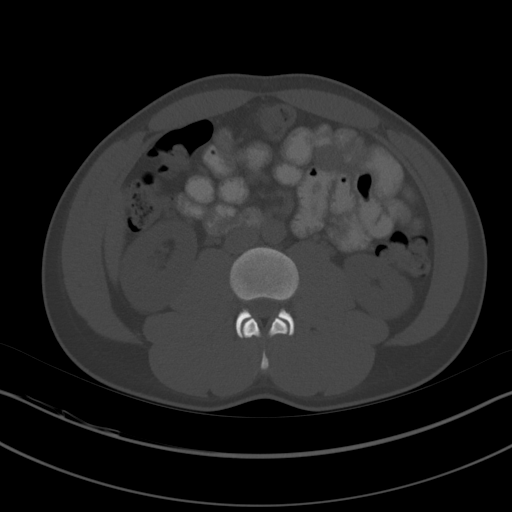
[im 70/98  soft-tissue]
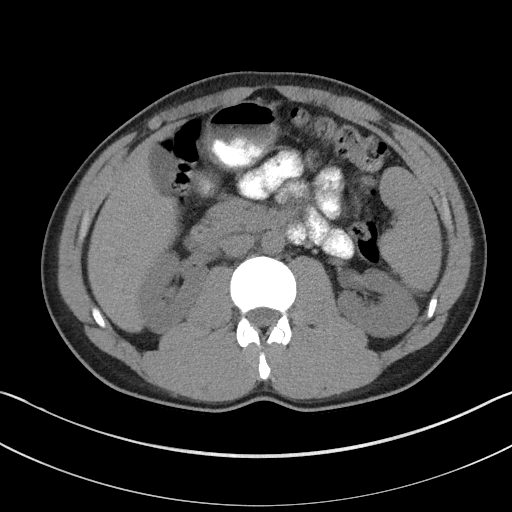
[im 78/98  soft-tissue]
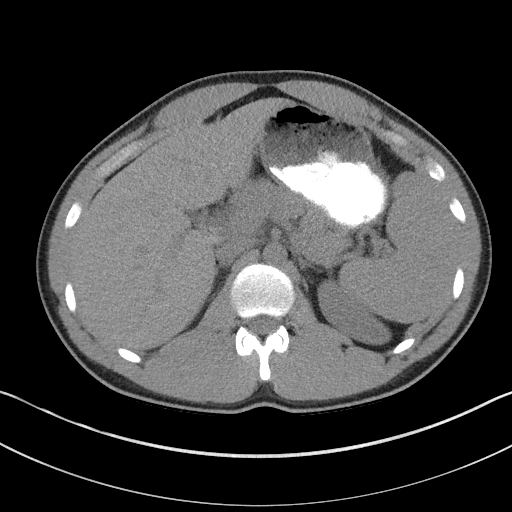
[im 82/98  lung]
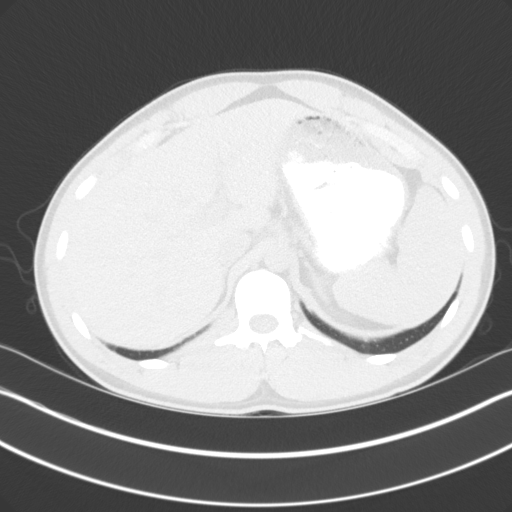
[im 86/98  soft-tissue]
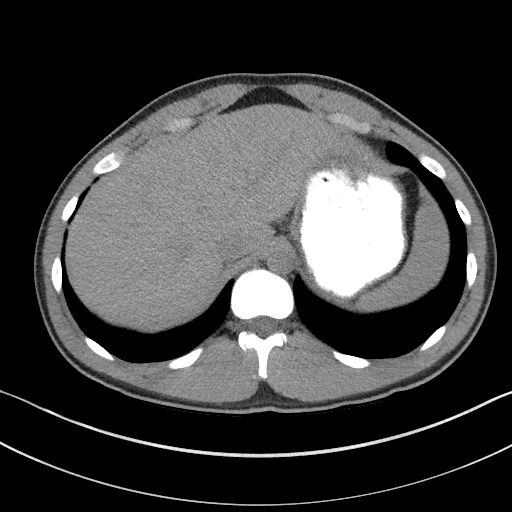
[im 86/98  lung]
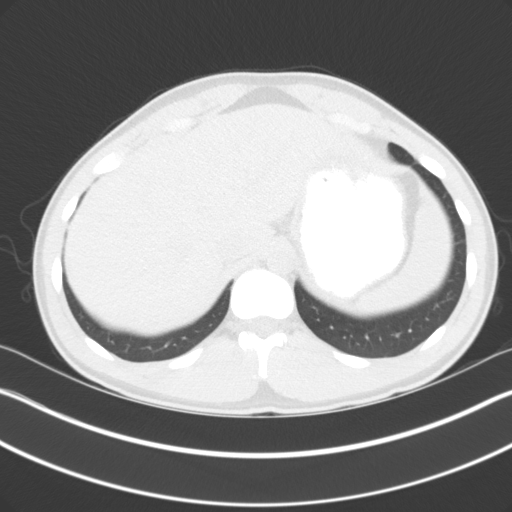
[im 90/98  lung]
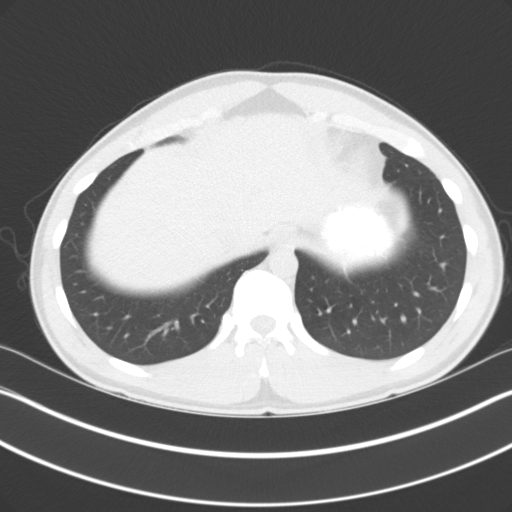
[im 94/98  soft-tissue]
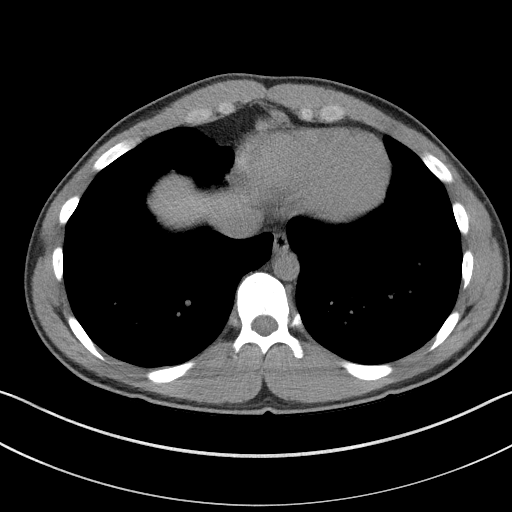
[im 94/98  lung]
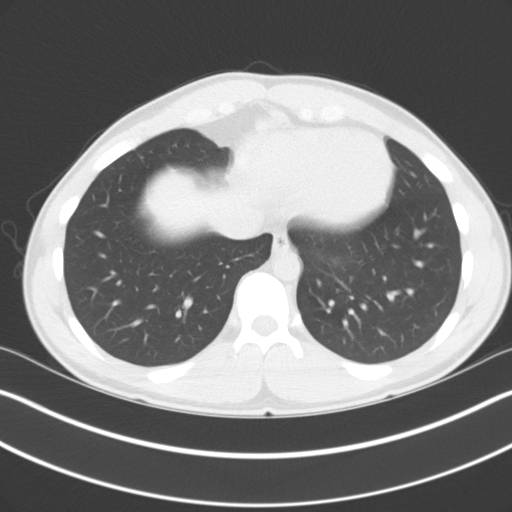

[15 of 32 positions shown; findings below may reference images not displayed]

FINDINGS: Lower chest: Lung bases are free of acute infiltrate or sizable
effusion.

Hepatobiliary: No focal liver abnormality is seen. No gallstones,
gallbladder wall thickening, or biliary dilatation.

Pancreas: Unremarkable. No pancreatic ductal dilatation or
surrounding inflammatory changes.

Spleen: Normal in size without focal abnormality.

Adrenals/Urinary Tract: Adrenal glands are unremarkable. Kidneys are
normal, without renal calculi, focal lesion, or hydronephrosis.
Bladder is unremarkable.

Stomach/Bowel: Stomach is within normal limits. Appendix appears
normal. No evidence of bowel wall thickening, distention, or
inflammatory changes.

Vascular/Lymphatic: No significant vascular findings are present.

Reproductive: Prostate is unremarkable.

Other: Fat is noted within the inguinal canals bilaterally with a
slight increase on the right with comparison to the left. Adjacent
to this in the inferior aspect of the pelvis is a mild amount of
inflammatory change which does not appear to be related to the
adjacent bowel. A few small adjacent lymph nodes are noted but not
significant by size criteria.

Musculoskeletal: Bilateral pars defects are noted at L5 without
anterolisthesis.
IMPRESSION: Normal-appearing appendix.

Minimal fat within the inguinal canals bilaterally right slightly
greater than left.

In the right hemipelvis anteriorly near the origin of the inguinal
canal a small amount of induration in the mesenteric fat is noted. A
few small lymph nodes are noted in this may represent some very
early mesenteric adenitis. This does not appear to be related to the
adjacent bowel or any of the other adjacent structures.

These results will be called to the ordering clinician or
representative by the Radiologist Assistant, and communication
documented in the PACS or zVision Dashboard.

## 2020-02-03 ENCOUNTER — Telehealth (HOSPITAL_BASED_OUTPATIENT_CLINIC_OR_DEPARTMENT_OTHER): Payer: Self-pay

## 2020-02-03 NOTE — Telephone Encounter (Signed)
Left message for patient that Dr. Brooke Dare has asked that we see this patient at the [facility] clinic. Patients injury can be better assessed at that location. Asked that he call [phone] to reschedule.

## 2020-02-03 NOTE — Progress Notes (Deleted)
TEL: [phone]    FAX: [phone]  [email or web address]    Specializing in sports-related injuries and non-surgical care for conditions of the spine,  shoulder, elbow, wrist, hand, hip, knee, foot, and ankle.        02/03/2020      Marcus Edwards  [id number]      REASON FOR VISIT/CONSULTATION:  No chief complaint on file.      HISTORY OF PRESENT ILLNESS:  Marcus Edwards is a 24 year old {LEFT RIGHT:[id number]}-hand dominant male being seen in consultation at the request of Referred, Self for the evaluation of their chief complaint.      The patient describes their pain as...  Onset:  Location:  Radiation:   Characteristic: *** / 10,   Exacerbation:   Relief:  Course:   Associated Symptoms:   Other Treatments/Procedures Tried:           Prior Imaging/Diagnostics          Pertinent medical history:   Goals:     PAST MEDICAL HISTORY:  There is no problem list on file for this patient.      PAST SURGICAL HISTORY:   He  has no past surgical history on file.    ALLERGIES:   Review of patient's allergies indicates:  Not on File    MEDICATIONS:   No current outpatient medications on file.     No current facility-administered medications for this visit.        FAMILY HISTORY:   Pertinent family history is not on file.    SOCIAL HISTORY:  He       REVIEW OF SYSTEMS: Except for those mentioned in the HPI and listed below, complete review of systems is negative: ***      PHYSICAL EXAMINATION:  VITAL SIGNS: There were no vitals taken for this visit. Exercise Vitals:  Marland Kitchen  GENERAL: He is Well-Developed and Well Nourished. They are alert and oriented.  PSYCHOLOGICAL:  No acute distress. Mood euthymic. Normal affect.  HEENT: Normal cephalic, atraumatic. Non-interic sclera. Moist mucous membranes   RESPIRATORY:  Non-labored breathing  CARDIOVASCULAR: No cyanosis, clubbing or edema. Intact peripheral pulses.  SKIN: No atrophy, rashes or open wounds involving the ***.

## 2020-02-05 ENCOUNTER — Encounter (HOSPITAL_BASED_OUTPATIENT_CLINIC_OR_DEPARTMENT_OTHER): Payer: BLUE CROSS/BLUE SHIELD | Admitting: Physical Medicine & Rehabilitation

## 2020-02-05 NOTE — Progress Notes (Signed)
TEL: [phone]    FAX: [phone]  [email or web address]    Specializing in sports-related injuries and non-surgical care for conditions of the spine,  shoulder, elbow, wrist, hand, hip, knee, foot, and ankle.        02/03/2020      [name]  [id number]      REASON FOR VISIT/CONSULTATION:  Left ring finger pain     HISTORY OF PRESENT ILLNESS:  Marcus Edwards is a 24 year old male being seen in consultation at the request of Referred, Self for the evaluation of their chief complaint.      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 Mostly bouldering since January, and prior to that [employer].     The patient describes their pain as...  Onset: Two weeks ago he was bouldering. Left hand was a crimp, R foot heel hooking in an overhang, when he felt and heard a pop.   Location: Left ring finger (volar aspect of MCP)  Radiation: Sometimes dorsally  Characteristic: 4 / 10 sharp pain   Exacerbation: Grip  Relief: rest  Course: Improving  Associated Symptoms: Tightness when driving or holding a videogame controller. No swelling or bruising  Other Treatments/Procedures Tried: Rest.   Prior Imaging/Diagnostics: none  Pertinent medical history: Denies prior finger injuries aside from a "sprain" in 5th grade  Goals: Return to climbing    PAST MEDICAL HISTORY:  There is no problem list on file for this patient.      PAST SURGICAL HISTORY:   He  has a past surgical history that includes Hernia Repair.    ALLERGIES:   Review of patient's allergies indicates:  No Known Allergies    MEDICATIONS:   No current outpatient medications on file.     No current facility-administered medications for this visit.        FAMILY HISTORY:   Pertinent family history is negative for No Family Hx.    SOCIAL HISTORY:  He  reports that he has never smoked. He has never used smokeless tobacco. He reports current alcohol use. He reports current drug use.  Drug: Marijuana.       PHYSICAL EXAMINATION:  VITAL SIGNS: BP 131/73    Pulse 57    Temp 36.8 C (Temporal)    Ht 5' 10.08" (1.78 m)    Wt 83.5 kg (184 lb)    BMI 26.34 kg/m  Exercise Vitals: Total Minutes of Exercise per Week: (!) 120.    MSK:   Inspection of the hand revealsmild swelling at the left ring finger primarily located at the volar side of the proximal phalanx. No bruising noted. There is tenderness to palpation at the same area. No triggering is felt during palpation of full flexion and extension of the 4th digit. The patient has full range of motion in the left wrist and fingers with mild pain on resisted finger flexion at the 4thdigit.   Neuro: Sensation is intact in a radial, median and ulnar nerve distribution.    Imaging: See Procedure Note Below for Limited Bilateral Diagnostic Ultrasound of the Hand    Impression: 24 year old rock climber with acute:     Left Ring Finger Complete A2 Pulley Rupture (see ultrasound report below)    Plan:  1. Activity Modification: avoid rock-climbing for now, and focus on cardiovascular fitness and cross-training.    2. Bracing: Continue to wear custom orthosis for 8 weeks. Brace may  come off at night. Obtain at [employer] world or at the following website:   [email or web address]    3. PT: Referral to [facility] for 8 weeks of therapy    4. Surgery is not indicated given that the A4 pulley is intact.     5. Imaging: Diagnostic US performed (see below). No need for Xrays at this time.     6. Follow up with me in 2-3 months     Charise Carwin, M.D.  Clinical Assistant Professor & Attending Physician  [facility] at [facility]      PROCEDURE NOTE    Limited diagnostic ultrasound scan of the left ring finger    Patient position: Seated with forearm neutral     Probe: A linear transducer was used.     Indication: left finger pain with suspected pulley injury    Scanning:   A Limited anatomic  specific diagnostic ultrasound scan of the left ring finger was performed. The following structures were viewed: The proximal, middle and distal phalax; the flexor digitorum superficialis and profundus tendons; the A1-A5 pulleys; the metacarpophalangeal (MCPJ), proximal (PIP) and distal interphalangeal (DIP) joints. All images are saved and annotated on the performing ultrasound machine and on [facility]. Static real-time views were obtained.    Findings:      The A1 pulley overlying the MCP joint is intact. There is an effusion which is tracking down the flexor tendon sheath from distally and collecting at the level of the A1 pulley.     The A2 pulley overlying the proximal phalanx is ruptured. Between the proximal phalanx and the flexor tendons there is an effusion.  * Tendon Phalynx Distance (at rest): 0.24 cm  * Tendon Phalynx Distance (w/ resistance): 0.44-0.47 cm     The A4 pulley overlying the middle phalanx is intact     FDS & FDP: There is no evidence of tendinopathy (heterogenous hypoechoic thickening) or tendon fiber disruption/tear (anechoic regions not corrected with anisotropy) throughout either the flexor digitorum superficialis or flexor digitorum profundus tendons.     The MCP, PIP, and DIP joints appear unremarkable, without effusion, bony erosion, or osteophytes.     IMPRESSIONS: Abnormal Study    1. Complete A2 Pulley Rupture     Charise Carwin, M.D., [facility]  Clinical Assistant Professor & Attending Physician  [facility] at [facility] and [facility]

## 2020-02-09 ENCOUNTER — Encounter (HOSPITAL_BASED_OUTPATIENT_CLINIC_OR_DEPARTMENT_OTHER): Payer: Self-pay | Admitting: Physical Medicine & Rehabilitation

## 2020-02-09 ENCOUNTER — Ambulatory Visit
Payer: BLUE CROSS/BLUE SHIELD | Attending: Physical Medicine & Rehabilitation | Admitting: Physical Medicine & Rehabilitation

## 2020-02-09 VITALS — BP 131/73 | HR 57 | Temp 98.3°F | Ht 70.08 in | Wt 184.0 lb

## 2020-02-09 DIAGNOSIS — S63408A Traumatic rupture of unspecified ligament of other finger at metacarpophalangeal and interphalangeal joint, initial encounter: Secondary | ICD-10-CM | POA: Insufficient documentation

## 2020-02-09 NOTE — Patient Instructions (Addendum)
Complete A2 pulley rupture    Plan:  1. Activity Modification: avoid rock-climbing for now, and focus on cardiovascular fitness and cross-training.    2. Bracing: Continue to wear custom orthosis for 8 weeks. Brace may come off at night. Obtain at [employer] world or at the following website:   [email or web address]    3. PT: Referral to [facility] for 8 weeks of therapy    4. Surgery is not indicated given that the A4 pulley is intact.     5. Follow up with me in 2-3 months

## 2020-06-05 ENCOUNTER — Telehealth (HOSPITAL_BASED_OUTPATIENT_CLINIC_OR_DEPARTMENT_OTHER): Payer: Self-pay | Admitting: Physical Medicine & Rehabilitation

## 2020-06-05 NOTE — Telephone Encounter (Signed)
RETURN CALL: Voicemail - Detailed Message      SUBJECT:  Appointment Request     REASON FOR VISIT: Right ring finger injury with pain  PREFERRED DATE/TIME: ASAP  ADDITIONAL INFORMATION: DOI 06/05/2020, Patient seen previously for left ring finger pain by Dr. Brooke Dare.  Patient now having pain in right ring finger.  CCR unable to schedule, provider not listed as treating Dx at this location.    Thank you.

## 2020-06-07 NOTE — Telephone Encounter (Signed)
LVM for patient to call back to schedule with Eustaquio Boyden  Patient Services Specialist  [facility] [place] [facility]  Ph: [phone] opt. 2  F: [phone]

## 2020-06-10 NOTE — Progress Notes (Signed)
Specializing in sports-related injuries and non-surgical care for conditions of the spine,  shoulder, elbow, wrist, hand, hip, knee, foot, and ankle.      CHIEF COMPLAINT:  Follow-Up       HISTORY OF PRESENT ILLNESS:  02/09/20: "Marcus Edwards is a 24 year old male being seen in consultation at the request of Referred, Self for the evaluation of their chief complaint.      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 Mostly bouldering since January, and prior to that [employer].     The patient describes their pain as...  Onset: Two weeks ago he was bouldering. Left hand was a crimp, R foot heel hooking in an overhang, when he felt and heard a pop.   Location: Left ring finger (volar aspect of MCP)  Radiation: Sometimes dorsally  Characteristic: 4 / 10 sharp pain   Exacerbation: Grip  Relief: rest  Course: Improving  Associated Symptoms: Tightness when driving or holding a videogame controller. No swelling or bruising  Other Treatments/Procedures Tried: Rest.   Prior Imaging/Diagnostics: none  Pertinent medical history: Denies prior finger injuries aside from a "sprain" in 5th grade  Goals: Return to climbing"    INTERVAL HISTORY:    This patient is a 24 year old male rock climber who was last seen on 02/09/2020 for a complete A2 pulley rupture firmed on diagnostic ultrasound that day with tendon phalanx distance of 0.24 cm at rest and greater than 0.44 cm with resistance.  Recommendations included activity modifications, use of a custom semilunar pulley orthosis brace, physical therapy at [facility], and follow-up in 2 to 3 months.    Fortunately, he made a full recovery after 8 weeks. Started climbing without brace at 13 weeks. Was fully climbing x 1 month when he suffered a right ring finger injury 5 days ago while doing a dynamic crimp hold. Today he comes in for pain on the contralateral right ring finger but the pain feels more distal, in the area of the  A4Pulley. He has already begun wearing his SPORT brace.      PAST MEDICAL HISTORY:  He  has a past medical history of NEGATIVE PAST MEDICAL HISTORY OF.    PAST SURGICAL HISTORY:   He  has a past surgical history that includes Hernia Repair.    CURRENT MEDICATIONS  No current outpatient medications on file.     No current facility-administered medications for this visit.        REVIEW OF SYSTEMS:  Except for those mentioned in the HPI, complete review of systems is negative.    PHYSICAL EXAMINATION  VITAL SIGNS: BP 114/68   Pulse 52   Temp 36.4 C (Temporal)  Exercise Vitals: Total Minutes of Exercise per Week: (!) 0. Physical Exam:  Vital signs: See chart.  Exercise Vitals: Total Minutes of Exercise per Week: (!) 0  MSK:   Inspection of the hand reveals no bruising or swelling.  There is tenderness to palpation at the middle phalanx volarly  No triggering is felt during palpation of full flexion and extension of the ring finger.   The patient has full range of motion in the R wrist and fingers with mild pain on resisted finger flexion at the DIP.   Neuro: Sensation is intact in a radial, median and ulnar nerve distribution.    Imaging: See Procedure Note Below for Limited Bilateral Diagnostic Ultrasound of the Hand    Impression:  R Ring Finger A4 Pulley Rupture (see ultrasound report below)    Plan:  1. Activity Modification:avoid rock-climbing for 8 weeks and focus on cardiovascular fitness and cross-training.  2. Bracing: Continue to wear custom orthosis for 8 weeks as directed. Brace may come off at night.   3. PT: Referral to [facility] provided  4. Follow up as needed    Charise Carwin, M.D., [facility]  Clinical Assistant Professor & Attending Physician  [facility]          PROCEDURE NOTE    Limited diagnostic ultrasound scan of the right finger    Patient position: Seated with forearm neutral     Probe: A linear hockey stick transducer was used.      Indication: right finger pain with suspected pulley injury    Scanning:   A Limited anatomic specific diagnostic ultrasound scan of the left ring finger was performed. The following structures were viewed in short and long axes: The proximal, middle and distal phalax; the flexor digitorum superficialis and profundus tendons; the A1-A5 pulleys; the metacarpophalangeal (MCPJ), proximal (PIP) and distal interphalangeal (DIP) joints. All images are saved and annotated on the performing ultrasound machine and on [facility]. Static real-time views were obtained.    Findings:      . The A1 pulley overlying the MCP joint is intact. There is no evidence of pulley thickening or dynamic triggering with flexion and extension.       . The Right A2 pulley overlying the proximal phalanx is intact. The following measurements were taken at  of the proximal phalanx:Tendon Phalanx Distance (at rest):  0.14     . The A3 pulley overlying the PIP joint is intact    . The A4 pulley overlying the middle phalanx is absent and there is an effusion both superficial and dorsal to the FDP tendon in this area. The following measurements were taken at  of the middle phalanx:  *Tendon Phalanx Distance (at rest): 0.20  * Tendon Phalanx Distance (w/ 3 kg resistance):  0.22    . THe A5 pulley overlying the DIP joint is intact     . FDS & FDP: There is no evidence of tendinopathy (heterogenous hypoechoic thickening) or tendon fiber disruption/tear (anechoic regions not corrected with anisotropy) throughout either the flexor digitorum superficialis or flexor digitorum profundus tendons.     . The MCP, PIP, and DIP joints appear unremarkable, without effusion, bony erosion, or osteophytes.      IMPRESSIONS: Abnormal Study     1. Right A4 Pulley Rupture with A4 TPD of 0.20 (rest) to 0.22 (resistance)      *T-P Distance in patients with normal ring finger A4 pulleys have been reported at a mean of 1.0 mm (at rest) and 1.3 mm (with resistance) in  Bassemir D, Unglaub F, Hahn P, Mller LP, Bruckner T, Spies CK. Sonographical parameters of the finger pulley system in healthy adults. Arch Orthop Trauma Surg. 2015;135(11):1615-1622.? See source below        Charise Carwin, M.D., [facility]  Clinical Assistant Professor & Attending Physician  [facility] and [facility]

## 2020-06-11 ENCOUNTER — Encounter (HOSPITAL_BASED_OUTPATIENT_CLINIC_OR_DEPARTMENT_OTHER): Payer: Self-pay | Admitting: Physical Medicine & Rehabilitation

## 2020-06-11 ENCOUNTER — Ambulatory Visit
Payer: BLUE CROSS/BLUE SHIELD | Attending: Physical Medicine & Rehabilitation | Admitting: Physical Medicine & Rehabilitation

## 2020-06-11 VITALS — BP 114/68 | HR 52 | Temp 97.6°F

## 2020-06-11 DIAGNOSIS — S6991XA Unspecified injury of right wrist, hand and finger(s), initial encounter: Secondary | ICD-10-CM | POA: Insufficient documentation

## 2020-06-11 DIAGNOSIS — X58XXXA Exposure to other specified factors, initial encounter: Secondary | ICD-10-CM | POA: Insufficient documentation

## 2020-06-11 DIAGNOSIS — S63408A Traumatic rupture of unspecified ligament of other finger at metacarpophalangeal and interphalangeal joint, initial encounter: Secondary | ICD-10-CM | POA: Insufficient documentation

## 2021-01-25 ENCOUNTER — Other Ambulatory Visit (HOSPITAL_BASED_OUTPATIENT_CLINIC_OR_DEPARTMENT_OTHER): Payer: Self-pay | Admitting: Physical Medicine & Rehabilitation
